# Patient Record
Sex: Male | Born: 1985 | Race: White | Hispanic: No | Marital: Single | Smoking: Current every day smoker
Health system: Southern US, Community
[De-identification: ages and names within clinical notes are randomized; demographics above are authoritative.]

---
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
## 2008-01-08 ENCOUNTER — Emergency Department (HOSPITAL_COMMUNITY): Admission: EM | Admit: 2008-01-08 | Discharge: 2008-01-08 | Payer: Self-pay | Admitting: Emergency Medicine

## 2008-01-19 ENCOUNTER — Emergency Department (HOSPITAL_COMMUNITY): Admission: EM | Admit: 2008-01-19 | Discharge: 2008-01-19 | Payer: Self-pay | Admitting: Emergency Medicine

## 2008-06-17 ENCOUNTER — Emergency Department (HOSPITAL_COMMUNITY): Admission: EM | Admit: 2008-06-17 | Discharge: 2008-06-17 | Payer: Self-pay | Admitting: Emergency Medicine

## 2008-06-22 ENCOUNTER — Emergency Department (HOSPITAL_COMMUNITY): Admission: EM | Admit: 2008-06-22 | Discharge: 2008-06-22 | Payer: Self-pay | Admitting: Emergency Medicine

## 2008-09-13 ENCOUNTER — Emergency Department (HOSPITAL_COMMUNITY): Admission: EM | Admit: 2008-09-13 | Discharge: 2008-09-13 | Payer: Self-pay | Admitting: Emergency Medicine

## 2009-10-09 ENCOUNTER — Emergency Department (HOSPITAL_COMMUNITY): Admission: EM | Admit: 2009-10-09 | Discharge: 2009-10-09 | Payer: Self-pay | Admitting: Emergency Medicine

## 2011-03-11 ENCOUNTER — Inpatient Hospital Stay (INDEPENDENT_AMBULATORY_CARE_PROVIDER_SITE_OTHER)
Admission: RE | Admit: 2011-03-11 | Discharge: 2011-03-11 | Disposition: A | Discharge status: Home / Self Care | Payer: Self-pay | Source: Ambulatory Visit | Attending: Family Medicine | Admitting: Family Medicine

## 2011-03-11 DIAGNOSIS — K047 Periapical abscess without sinus: Secondary | ICD-10-CM

## 2016-12-10 ENCOUNTER — Encounter: Payer: Self-pay | Admitting: Licensed Clinical Social Worker

## 2016-12-10 ENCOUNTER — Ambulatory Visit: Payer: Self-pay | Attending: Family Medicine | Admitting: Family Medicine

## 2016-12-10 VITALS — BP 129/88 | HR 73 | Temp 98.3°F | Resp 18 | Ht 71.0 in | Wt 192.4 lb

## 2016-12-10 DIAGNOSIS — K5903 Drug induced constipation: Secondary | ICD-10-CM

## 2016-12-10 DIAGNOSIS — F329 Major depressive disorder, single episode, unspecified: Secondary | ICD-10-CM | POA: Insufficient documentation

## 2016-12-10 DIAGNOSIS — R140 Abdominal distension (gaseous): Secondary | ICD-10-CM | POA: Insufficient documentation

## 2016-12-10 DIAGNOSIS — G4700 Insomnia, unspecified: Secondary | ICD-10-CM

## 2016-12-10 DIAGNOSIS — F32A Depression, unspecified: Secondary | ICD-10-CM

## 2016-12-10 DIAGNOSIS — Z79891 Long term (current) use of opiate analgesic: Secondary | ICD-10-CM | POA: Insufficient documentation

## 2016-12-10 DIAGNOSIS — K5909 Other constipation: Secondary | ICD-10-CM | POA: Insufficient documentation

## 2016-12-10 DIAGNOSIS — F1410 Cocaine abuse, uncomplicated: Secondary | ICD-10-CM | POA: Insufficient documentation

## 2016-12-10 MED ORDER — FLEET ENEMA 7-19 GM/118ML RE ENEM: 1.0000 | ENEMA | Freq: Once | RECTAL | 0 refills | Status: AC

## 2016-12-10 MED ORDER — SENNOSIDES-DOCUSATE SODIUM 8.6-50 MG PO TABS: 2.0000 | ORAL_TABLET | Freq: Every day | ORAL | 3 refills | Status: DC

## 2016-12-10 MED ORDER — BUPROPION HCL ER (SR) 150 MG PO TB12: 150.0000 mg | ORAL_TABLET | Freq: Every day | ORAL | 1 refills | Status: DC

## 2016-12-10 MED ORDER — SENNOSIDES-DOCUSATE SODIUM 8.6-50 MG PO TABS: 2.0000 | ORAL_TABLET | Freq: Every day | ORAL | Status: DC

## 2016-12-10 MED ORDER — FLEET ENEMA 7-19 GM/118ML RE ENEM: 1.0000 | ENEMA | Freq: Once | RECTAL | 0 refills | Status: DC

## 2016-12-10 MED FILL — ?BUPROPION HCL SR 150 MG TA: 150 | 30 days supply | Qty: 60 | Fill #0

## 2016-12-10 NOTE — Progress Notes (Signed)
Patient is here for establish care  Patient complains about no appetite he said that he eats till 5 pm  Patient stated that he has a bowel movement every 2/3 weeks   Patient requested to get his liver check  Patient declined the flu shot today

## 2016-12-10 NOTE — Progress Notes (Signed)
Subjective:  Patient ID: Stephen Garrison, male    DOB: 1986/09/26  Age: 31 y.o. MRN: [id number]  CC: Establish Care   HPI Zackarie Chason presents forA 2 week history of constipation. He reports history of chronic constipation which worsened one year ago.He reports taking Colace every day or usually every other day. He reports taking methadone 80 mg daily. He reports history of substance abuse which include cocaine and OxyContin in the past. Reports receiving ongoing treatment at [facility]. He also reports history of depression and difficulty sleeping. He denies any SI/HI.      No outpatient prescriptions prior to visit.   No facility-administered medications prior to visit.     ROS Review of Systems  Constitutional: Negative.   Respiratory: Negative.   Cardiovascular: Negative.   Gastrointestinal: Positive for abdominal distention and constipation.  Psychiatric/Behavioral: Positive for dysphoric mood and sleep disturbance. Negative for suicidal ideas. The patient is nervous/anxious.     Objective:  BP 129/88 (BP Location: Left Arm, Patient Position: Sitting, Cuff Size: Normal)   Pulse 73   Temp 98.3 F (36.8 C) (Oral)   Resp 18   Ht 5\' 11"  (1.803 m)   Wt 192 lb 6.4 oz (87.3 kg)   SpO2 96%   BMI 26.83 kg/m   BP/Weight 12/10/2016  Systolic BP 129  Diastolic BP 88  Wt. (Lbs) 192.4  BMI 26.83     Physical Exam  HENT:  Mouth/Throat: Oropharynx is clear and moist.  Cardiovascular: Normal rate, regular rhythm, normal heart sounds and intact distal pulses.   Pulmonary/Chest: Effort normal and breath sounds normal.  Abdominal: Soft. Bowel sounds are normal.  Psychiatric: His speech is delayed. He is slowed. He exhibits a depressed mood. He expresses no homicidal and no suicidal ideation. He expresses no suicidal plans and no homicidal plans.  Nursing note and vitals reviewed.    Assessment & Plan:   Problem List Items Addressed This Visit    -LCSW  provided patient with counseling resources.    Visit Diagnoses    Drug-induced constipation    -  Primary   -Recommended OTC fleet enema.   Relevant Medications   senna-docusate (SENNA S) 8.6-50 MG tablet   Depression, unspecified depression type       Relevant Medications   buPROPion (WELLBUTRIN SR) 150 MG 12 hr tablet   Insomnia, unspecified type       Relevant Medications   buPROPion (WELLBUTRIN SR) 150 MG 12 hr tablet      Meds ordered this encounter  Medications  . DISCONTD: senna-docusate (SENNA S) 8.6-50 MG tablet    Sig: Take 2 tablets by mouth at bedtime.    Order Specific Question:   Supervising Provider    Answer:   Quentin Angst [id number]  . DISCONTD: buPROPion (WELLBUTRIN SR) 150 MG 12 hr tablet    Sig: Take 1 tablet (150 mg total) by mouth daily. After 3 days, may increase to 1 tablet (150 mg each) twice a day.    Dispense:  60 tablet    Refill:  1    Order Specific Question:   Supervising Provider    Answer:   Quentin Angst [id number]  . DISCONTD: sodium phosphate (FLEET) 7-19 GM/118ML ENEM    Sig: Place 133 mLs (1 enema total) rectally once.    Refill:  0    Order Specific Question:   Supervising Provider    Answer:   Quentin Angst [id number]  . DISCONTD: senna-docusate (SENNA  S) 8.6-50 MG tablet    Sig: Take 2 tablets by mouth at bedtime.    Dispense:  60 tablet    Refill:  3    Order Specific Question:   Supervising Provider    Answer:   Quentin AngstJEGEDE, OLUGBEMIGA E [id number]]  . senna-docusate (SENNA S) 8.6-50 MG tablet    Sig: Take 2 tablets by mouth at bedtime.    Dispense:  60 tablet    Refill:  3    Order Specific Question:   Supervising Provider    Answer:   Quentin AngstJEGEDE, OLUGBEMIGA E [id number]]  . sodium phosphate (FLEET) 7-19 GM/118ML ENEM    Sig: Place 133 mLs (1 enema total) rectally once.    Refill:  0    Order Specific Question:   Supervising Provider    Answer:   Quentin AngstJEGEDE, OLUGBEMIGA E [id number]]  . buPROPion (WELLBUTRIN SR) 150 MG 12  hr tablet    Sig: Take 1 tablet (150 mg total) by mouth daily. After 3 days, may increase to 1 tablet (150 mg each) twice a day.    Dispense:  60 tablet    Refill:  1    Order Specific Question:   Supervising Provider    Answer:   Quentin AngstJEGEDE, OLUGBEMIGA E [id number]]    Follow-up: Return if symptoms worsen or fail to improve. Schedule Physical in about 2 weeks (around 12/24/2016).   Lizbeth BarkMandesia R Hairston FNP

## 2016-12-10 NOTE — Patient Instructions (Addendum)
Follow up if symptoms do not improve. Schedule appointment for physical in 2 weeks.    Constipation, Adult Constipation is when a person:  Poops (has a bowel movement) fewer times in a week than normal.  Has a hard time pooping.  Has poop that is dry, hard, or bigger than normal. Follow these instructions at home: Eating and drinking  Eat foods that have a lot of fiber, such as:  Fresh fruits and vegetables.  Whole grains.  Beans.  Eat less of foods that are high in fat, low in fiber, or overly processed, such as:  [place] fries.  Hamburgers.  Cookies.  Candy.  Soda.  Drink enough fluid to keep your pee (urine) clear or pale yellow. General instructions  Exercise regularly or as told by your doctor.  Go to the restroom when you feel like you need to poop. Do not hold it in.  Take over-the-counter and prescription medicines only as told by your doctor. These include any fiber supplements.  Do pelvic floor retraining exercises, such as:  Doing deep breathing while relaxing your lower belly (abdomen).  Relaxing your pelvic floor while pooping.  Watch your condition for any changes.  Keep all follow-up visits as told by your doctor. This is important. Contact a doctor if:  You have pain that gets worse.  You have a fever.  You have not pooped for 4 days.  You throw up (vomit).  You are not hungry.  You lose weight.  You are bleeding from the anus.  You have thin, pencil-like poop (stool). Get help right away if:  You have a fever, and your symptoms suddenly get worse.  You leak poop or have blood in your poop.  Your belly feels hard or bigger than normal (is bloated).  You have very bad belly pain.  You feel dizzy or you faint. This information is not intended to replace advice given to you by your health care provider. Make sure you discuss any questions you have with your health care provider. Document Released: 04/01/2008 Document  Revised: 05/03/2016 Document Reviewed: 04/03/2016 Elsevier Interactive Patient Education  2017 [employer]

## 2016-12-12 NOTE — BH Specialist Note (Signed)
Session Start time: 10:45 AM   End Time: 11:15 AM Total Time:  30 minutes Type of Service: Behavioral Health - Individual/Family Interpreter: No.   Interpreter Name & Language: N/A # [facility]-June [facility]   SUBJECTIVE: Sheran LawlessCharles Hutt is a 31 y.o. male  Pt. was referred by FNP Jenelle MagesHairston for:  anxiety, depression and community resources (substance use treatment). Pt. reports the following symptoms/concerns: decreased appetite, difficulty staying asleep, racing thoughts, and irritability Duration of problem:  Ongoing Pt reports that Bipolar, Anxiety, and Depression is common with maternal family Severity: moderate Previous treatment: [facility] two [facility], stating "it didn't help much"   OBJECTIVE: Mood: Pleasant & Affect: Appropriate Risk of harm to self or others: Pt reports hx of SI/HI. Pt denies current plan or intent to self-harm and/or harm others Assessments administered: PHQ-9; GAD-7  LIFE CONTEXT:  Family & Social: Pt resides with his mother and young adult brother. He has a 42seven year old daughter who he hasn't seen in months due to ongoing conflict with daughter's step-father School/ Work: Pt is unemployed. He is receiving food stamps ($189) Pt was denied Medicaid and is uninsured Self-Care: Pt has hx of using cocaine, opioids, and OxyContin. He has [facility] for five years and is currently on methadone Life changes: Pt is grieving the loss of his father (10/19/16) and has initiated court involvement to assist in obtaining visitation rights with daughter What is important to pt/family (values): Family, Sobriety, Independence   GOALS ADDRESSED:  Decrease symptoms of depression Increase adequate support system for patient  INTERVENTIONS: Solution Focused, Strength-based and Supportive   ASSESSMENT:  Pt currently experiencing depression triggered by financial strain  and ongoing conflict regarding visitation rights to see daughter. Pt reports decreased appetite, difficulty staying asleep, racing thoughts, irritability, and suicidal/homocidal ideations. He is currently participating in substance use treatment through [facility]. Pt may benefit from psychotherapy and medication management. LCSWA provided education on how psychosocial stressors can negatively impact one's mental and physical health. LCSWA discussed benefits of applying healthy coping skills to decrease symptoms. LCSWA inquired about protective factors and assisted pt in creating a safety plan in crisis. Pt successfully identified strategies to utilize on a daily basis and when attempting to avoid conflict with daughter's step-father. LCSWA strongly encouraged pt to schedule appointment with Financial Counselor to assist with financial strain and provided pt with employment resources for job readiness.       PLAN: 1. F/U with behavioral health clinician: Pt was encouraged to contact LCSWA if symptoms worsen or fail to improve to [facility]. 2. Behavioral Health meds: Wellbutrin 3. Behavioral recommendations: LCSWA recommends that pt apply healthy coping skills discussed, continue to participate in substance use treatment, and utilize resources. Pt is encouraged to schedule follow up appointment with LCSWA 4. Referral: Brief Counseling/Psychotherapy, [facility], Problem-solving teaching/coping strategies, Psychoeducation and Supportive Counseling 5. From scale of 1-10, how likely are you to follow plan: 8/10   Bridgett LarssonJasmine D Lewis, MSW, [facility] Clinical Social Worker 12/12/16 10:07 AM  Warmhandoff:   Warm Hand Off Completed.

## 2016-12-24 ENCOUNTER — Ambulatory Visit: Payer: Self-pay | Attending: Family Medicine

## 2016-12-24 ENCOUNTER — Ambulatory Visit (HOSPITAL_BASED_OUTPATIENT_CLINIC_OR_DEPARTMENT_OTHER): Payer: Self-pay | Admitting: Family Medicine

## 2016-12-24 ENCOUNTER — Encounter: Payer: Self-pay | Admitting: Licensed Clinical Social Worker

## 2016-12-24 VITALS — BP 131/87 | HR 68 | Temp 98.4°F | Resp 18 | Ht 72.0 in | Wt 190.0 lb

## 2016-12-24 DIAGNOSIS — F1911 Other psychoactive substance abuse, in remission: Secondary | ICD-10-CM | POA: Insufficient documentation

## 2016-12-24 DIAGNOSIS — F329 Major depressive disorder, single episode, unspecified: Secondary | ICD-10-CM

## 2016-12-24 DIAGNOSIS — Z0000 Encounter for general adult medical examination without abnormal findings: Secondary | ICD-10-CM | POA: Insufficient documentation

## 2016-12-24 DIAGNOSIS — Z87898 Personal history of other specified conditions: Secondary | ICD-10-CM

## 2016-12-24 DIAGNOSIS — K5909 Other constipation: Secondary | ICD-10-CM | POA: Insufficient documentation

## 2016-12-24 DIAGNOSIS — F32A Depression, unspecified: Secondary | ICD-10-CM | POA: Insufficient documentation

## 2016-12-24 DIAGNOSIS — F1910 Other psychoactive substance abuse, uncomplicated: Secondary | ICD-10-CM | POA: Insufficient documentation

## 2016-12-24 LAB — CBC WITH DIFFERENTIAL/PLATELET
BASOS PCT: 0 %
Basophils Absolute: 0 cells/uL (ref 0–200)
EOS ABS: 92 {cells}/uL (ref 15–500)
EOS PCT: 1 %
HCT: 44.6 % (ref 38.5–50.0)
Hemoglobin: 15.2 g/dL (ref 13.2–17.1)
Lymphocytes Relative: 28 %
Lymphs Abs: 2576 cells/uL (ref 850–3900)
MCH: 29.7 pg (ref 27.0–33.0)
MCHC: 34.1 g/dL (ref 32.0–36.0)
MCV: 87.3 fL (ref 80.0–100.0)
MONOS PCT: 6 %
MPV: 11.4 fL (ref 7.5–12.5)
Monocytes Absolute: 552 cells/uL (ref 200–950)
NEUTROS ABS: 5980 {cells}/uL (ref 1500–7800)
Neutrophils Relative %: 65 %
PLATELETS: 204 10*3/uL (ref 140–400)
RBC: 5.11 MIL/uL (ref 4.20–5.80)
RDW: 12.9 % (ref 11.0–15.0)
WBC: 9.2 10*3/uL (ref 3.8–10.8)

## 2016-12-24 LAB — BASIC METABOLIC PANEL WITH GFR
BUN: 9 mg/dL (ref 7–25)
CALCIUM: 9.7 mg/dL (ref 8.6–10.3)
CO2: 27 mmol/L (ref 20–31)
Chloride: 101 mmol/L (ref 98–110)
Creat: 1 mg/dL (ref 0.60–1.35)
GFR, Est African American: >89 mL/min (ref >=60)
Glucose, Bld: 81 mg/dL (ref 65–99)
POTASSIUM: 4.5 mmol/L (ref 3.5–5.3)
SODIUM: 135 mmol/L (ref 135–146)

## 2016-12-24 LAB — HEPATIC FUNCTION PANEL
ALK PHOS: 65 U/L (ref 40–115)
ALT: 38 U/L (ref 9–46)
AST: 27 U/L (ref 10–40)
Albumin: 4.5 g/dL (ref 3.6–5.1)
BILIRUBIN DIRECT: 0.1 mg/dL (ref <=0.2)
BILIRUBIN TOTAL: 0.3 mg/dL (ref 0.2–1.2)
Indirect Bilirubin: 0.2 mg/dL (ref 0.2–1.2)
Total Protein: 7.4 g/dL (ref 6.1–8.1)

## 2016-12-24 LAB — LIPID PANEL
Cholesterol: 185 mg/dL (ref <200)
HDL: 40 mg/dL — ABNORMAL LOW (ref >40)
LDL Cholesterol: 119 mg/dL — ABNORMAL HIGH (ref <100)
Total CHOL/HDL Ratio: 4.6 Ratio (ref <5.0)
Triglycerides: 128 mg/dL (ref <150)
VLDL: 26 mg/dL (ref <30)

## 2016-12-24 NOTE — BH Specialist Note (Signed)
LCSWA visited with pt to follow up on behavioral health.  Pt reported that he is complying with medication management (wellbutrin) He stated that he has not yet observed a difference in symptoms; however, understands that it can take 4-6 weeks to see results.   Pt completed appointment with Financial Counseling and has applied for [employer]. No additional concerns noted. Pt was encouraged tocontact LCSWA if symptoms worsen or fail to improveto schedule behavioral appointments at [facility].

## 2016-12-24 NOTE — Progress Notes (Signed)
Subjective:   Patient ID: Stephen Lawlessharles Garrison, male    DOB: 07/26/1986, 31 y.o.   MRN: [id number]  Chief Complaint  Patient presents with  . Annual Exam   HPI Stephen LawlessCharles Horsman 31 y.o. male presents    Annual physical exam: History of chronic constipation and methadone use. Denies symptoms of all other pertinent systems.  History of Depression: PHQ9 score elevated this visit. He denies any SI/HI. He reports visitation issues and recent history of father's death as a stressor. Reports support system includes his mother. He is adherent with antidepressant medication.                                                                       No past medical history on file.  No past surgical history on file.  Family History  Problem Relation Age of Onset  . Hypertension Mother   . Panic disorder Mother   . Anxiety disorder Mother   . Diabetes Maternal Grandmother     Social History   Social History  . Marital status: Single    Spouse name: N/A  . Number of children: N/A  . Years of education: N/A   Occupational History  . Not on file.   Social History Main Topics  . Smoking status: Not on file  . Smokeless tobacco: Not on file  . Alcohol use Not on file  . Drug use: Unknown  . Sexual activity: Not on file   Other Topics Concern  . Not on file   Social History Narrative  . No narrative on file    Outpatient Medications Prior to Visit  Medication Sig Dispense Refill  . buPROPion (WELLBUTRIN SR) 150 MG 12 hr tablet Take 1 tablet (150 mg total) by mouth daily. After 3 days, may increase to 1 tablet (150 mg each) twice a day. 60 tablet 1  . methadone (DOLOPHINE) 10 MG/5ML solution Take 80 mg by mouth every morning.    . senna-docusate (SENNA S) 8.6-50 MG tablet Take 2 tablets by mouth at bedtime. (Patient not taking: Reported on 12/24/2016) 60 tablet 3   No facility-administered medications prior to visit.     No Known Allergies  Review of Systems  Constitutional: Negative.     HENT: Negative.   Eyes: Negative.   Respiratory: Negative.   Cardiovascular: Negative.   Gastrointestinal: Positive for abdominal pain and nausea.  Genitourinary: Negative.   Musculoskeletal: Negative.   Skin: Negative.   Neurological: Negative.   Endo/Heme/Allergies: Negative.   Psychiatric/Behavioral: Positive for depression (history of depression) and substance abuse (past history of substance abuse.).       Objective:    Physical Exam  Constitutional: He is oriented to person, place, and time. He appears well-developed and well-nourished.  HENT:  Head: Normocephalic and atraumatic.  Right Ear: External ear normal.  Left Ear: External ear normal.  Nose: Nose normal.  Mouth/Throat: Oropharynx is clear and moist.  Eyes: Conjunctivae and EOM are normal. Pupils are equal, round, and reactive to light.  Neck: Normal range of motion. Neck supple.  Cardiovascular: Normal rate, regular rhythm, normal heart sounds and intact distal pulses.   Pulmonary/Chest: Effort normal and breath sounds normal.  Abdominal: Soft. Bowel sounds are normal.  Musculoskeletal: Normal range  of motion.  Neurological: He is alert and oriented to person, place, and time. He has normal reflexes.  Skin: Skin is warm and dry.  Psychiatric: He expresses no homicidal and no suicidal ideation. He expresses no suicidal plans and no homicidal plans.  Nursing note and vitals reviewed.   BP 131/87 (BP Location: Left Arm, Patient Position: Sitting, Cuff Size: Normal)   Pulse 68   Temp 98.4 F (36.9 C) (Oral)   Resp 18   Ht 6' (1.829 m)   Wt 190 lb (86.2 kg)   SpO2 97%   BMI 25.77 kg/m  Wt Readings from Last 3 Encounters:  12/24/16 190 lb (86.2 kg)  12/10/16 192 lb 6.4 oz (87.3 kg)     There is no immunization history on file for this patient.  Lab Results  Component Value Date   TSH 4.20 12/24/2016   Lab Results  Component Value Date   WBC 9.2 12/24/2016   HGB 15.2 12/24/2016   HCT 44.6  12/24/2016   MCV 87.3 12/24/2016   PLT 204 12/24/2016   Lab Results  Component Value Date   NA 135 12/24/2016   K 4.5 12/24/2016   CO2 27 12/24/2016   GLUCOSE 81 12/24/2016   BUN 9 12/24/2016   CREATININE 1.00 12/24/2016   BILITOT 0.3 12/24/2016   ALKPHOS 65 12/24/2016   AST 27 12/24/2016   ALT 38 12/24/2016   PROT 7.4 12/24/2016   ALBUMIN 4.5 12/24/2016   CALCIUM 9.7 12/24/2016   Lab Results  Component Value Date   CHOL 185 12/24/2016   Lab Results  Component Value Date   HDL 40 (L) 12/24/2016   Lab Results  Component Value Date   LDLCALC 119 (H) 12/24/2016   Lab Results  Component Value Date   TRIG 128 12/24/2016   Lab Results  Component Value Date   CHOLHDL 4.6 12/24/2016   Lab Results  Component Value Date   HGBA1C 5.3 12/24/2016       Assessment & Plan:   Problem List Items Addressed This Visit      Other   Depression   -LCSW spoke with patient and provided resources.   History of substance abuse   -Pt.reports receiving drug counseling.    Other Visit Diagnoses    Annual physical exam    -  Primary   Relevant Orders   BASIC METABOLIC PANEL WITH GFR (Completed)   CBC with Differential (Completed)   TSH (Completed)   Hemoglobin A1c (Completed)   Vitamin D, 25-hydroxy (Completed)   Lipid Panel (Completed)   Hepatic Function Panel (Completed)       Return in about 4 weeks (around 01/21/2017) for Depression Follow Up .   Arrie Senate, FNP

## 2016-12-24 NOTE — Progress Notes (Signed)
Patient is here for physical  Patient denies pain today  Patient has not eaten today  Patient ha staking his current meds today  Patient declined the flu shot today

## 2016-12-24 NOTE — Patient Instructions (Signed)
Living With Depression Everyone experiences occasional disappointment, sadness, and loss in their lives. When you are feeling down, blue, or sad for at least 2 weeks in a row, it may mean that you have depression. Depression can affect your thoughts and feelings, relationships, daily activities, and physical health. It is caused by changes in the way your brain functions. If you receive a diagnosis of depression, your health care provider will tell you which type of depression you have and what treatment options are available to you. If you are living with depression, there are ways to help you recover from it and also ways to prevent it from coming back. How to cope with lifestyle changes Coping with stress  Stress is your body's reaction to life changes and events, both good and bad. Stressful situations may include:  Getting married.  The death of a spouse.  Losing a job.  Retiring.  Having a baby. Stress can last just a few hours or it can be ongoing. Stress can play a major role in depression, so it is important to learn both how to cope with stress and how to think about it differently. Talk with your health care provider or a counselor if you would like to learn more about stress reduction. He or she may suggest some stress reduction techniques, such as:  Music therapy. This can include creating music or listening to music. Choose music that you enjoy and that inspires you.  Mindfulness-based meditation. This kind of meditation can be done while sitting or walking. It involves being aware of your normal breaths, rather than trying to control your breathing.  Centering prayer. This is a kind of meditation that involves focusing on a spiritual word or phrase. Choose a word, phrase, or sacred image that is meaningful to you and that brings you peace.  Deep breathing. To do this, expand your stomach and inhale slowly through your nose. Hold your breath for 3-5 seconds, then exhale slowly,  allowing your stomach muscles to relax.  Muscle relaxation. This involves intentionally tensing muscles then relaxing them. Choose a stress reduction technique that fits your lifestyle and personality. Stress reduction techniques take time and practice to develop. Set aside 5-15 minutes a day to do them. Therapists can offer training in these techniques. The training may be covered by some insurance plans. Other things you can do to manage stress include:  Keeping a stress diary. This can help you learn what triggers your stress and ways to control your response.  Understanding what your limits are and saying no to requests or events that lead to a schedule that is too full.  Thinking about how you respond to certain situations. You may not be able to control everything, but you can control how you react.  Adding humor to your life by watching funny films or TV shows.  Making time for activities that help you relax and not feeling guilty about spending your time this way. Medicines  Your health care provider may suggest certain medicines if he or she feels that they will help improve your condition. Avoid using alcohol and other substances that may prevent your medicines from working properly (may interact). It is also important to:  Talk with your pharmacist or health care provider about all the medicines that you take, their possible side effects, and what medicines are safe to take together.  Make it your goal to take part in all treatment decisions (shared decision-making). This includes giving input on the side effects   of medicines. It is best if shared decision-making with your health care provider is part of your total treatment plan. If your health care provider prescribes a medicine, you may not notice the full benefits of it for 4-8 weeks. Most people who are treated for depression need to be on medicine for at least 6-12 months after they feel better. If you are taking medicines as  part of your treatment, do not stop taking medicines without first talking to your health care provider. You may need to have the medicine slowly decreased (tapered) over time to decrease the risk of harmful side effects. Relationships  Your health care provider may suggest family therapy along with individual therapy and drug therapy. While there may not be family problems that are causing you to feel depressed, it is still important to make sure your family learns as much as they can about your mental health. Having your family's support can help make your treatment successful. How to recognize changes in your condition Everyone has a different response to treatment for depression. Recovery from major depression happens when you have not had signs of major depression for two months. This may mean that you will start to:  Have more interest in doing activities.  Feel less hopeless than you did 2 months ago.  Have more energy.  Overeat less often, or have better or improving appetite.  Have better concentration. Your health care provider will work with you to decide the next steps in your recovery. It is also important to recognize when your condition is getting worse. Watch for these signs:  Having fatigue or low energy.  Eating too much or too little.  Sleeping too much or too little.  Feeling restless, agitated, or hopeless.  Having trouble concentrating or making decisions.  Having unexplained physical complaints.  Feeling irritable, angry, or aggressive. Get help as soon as you or your family members notice these symptoms coming back. How to get support and help from others How to talk with friends and family members about your condition  Talking to friends and family members about your condition can provide you with one way to get support and guidance. Reach out to trusted friends or family members, explain your symptoms to them, and let them know that you are working with a  health care provider to treat your depression. Financial resources  Not all insurance plans cover mental health care, so it is important to check with your insurance carrier. If paying for co-pays or counseling services is a problem, search for a local or county mental health care center. They may be able to offer public mental health care services at low or no cost when you are not able to see a private health care provider. If you are taking medicine for depression, you may be able to get the generic form, which may be less expensive. Some makers of prescription medicines also offer help to patients who cannot afford the medicines they need. Follow these instructions at home:  Get the right amount and quality of sleep.  Cut down on using caffeine, tobacco, alcohol, and other potentially harmful substances.  Try to exercise, such as walking or lifting small weights.  Take over-the-counter and prescription medicines only as told by your health care provider.  Eat a healthy diet that includes plenty of vegetables, fruits, whole grains, low-fat dairy products, and lean protein. Do not eat a lot of foods that are high in solid fats, added sugars, or salt.  Keep   all follow-up visits as told by your health care provider. This is important. Contact a health care provider if:  You stop taking your antidepressant medicines, and you have any of these symptoms:  Nausea.  Headache.  Feeling lightheaded.  Chills and body aches.  Not being able to sleep (insomnia).  You or your friends and family think your depression is getting worse. Get help right away if:  You have thoughts of hurting yourself or others. If you ever feel like you may hurt yourself or others, or have thoughts about taking your own life, get help right away. You can go to your nearest emergency department or call:  Your local emergency services (911 in the [place]  A suicide crisis helpline, such as the National Suicide  Prevention Lifeline at (334)217-07191-813-589-2861. This is open 24-hours a day. Summary  If you are living with depression, there are ways to help you recover from it and also ways to prevent it from coming back.  Work with your health care team to create a management plan that includes counseling, stress management techniques, and healthy lifestyle habits. This information is not intended to replace advice given to you by your health care provider. Make sure you discuss any questions you have with your health care provider. Document Released: 09/16/2016 Document Revised: 09/16/2016 Document Reviewed: 09/16/2016 Elsevier Interactive Patient Education  2017 [employer]   Healthy Eating to Prevent Digestive Disorders The digestive system starts at the mouth and goes all the way down to the rectum. Along the way, your digestive system breaks down the food you eat so you can absorb its nutrients and use them for energy. Digestive disorders can cause gas, bloating, pain, heartburn, and other symptoms. They can prevent your digestive system from doing its job. Healthy eating and a healthy lifestyle can help you avoid many common digestive disorders. What nutrition changes can be made? Start by eating a balanced diet. Eat healthy foods from all the major food groups. These include carbohydrates, fats, and proteins. Other changes you can make include to:  Eat enough fiber. Fiber is a healthy carbohydrate that cleans out your digestive system. Fiber absorbs water and helps you have regular bowel movements. Fiber comes from plants. To get enough fiber in your diet, eat 4-5 servings of fruits, vegetables, and legumes every day. Include beans and whole grains. Most people should get 20?35 grams of fiber each day.  Drink enough water to keep your urine clear or pale yellow. Water helps your body digest food. It can also help prevent constipation.  Avoid fatty proteins. Full-fat dairy products and fatty meats are hard  to digest. Fats you want to avoid are those that get solid at room temperature (saturated fats). Instead of eating these kinds of fats, eat plant-based unsaturated fats found in olives, canola, corn, avocado, and nuts.  If you have trouble with gas, belching, or flatulence, avoid gas-producing foods. These include beans, carbonated beverages, cabbage, cauliflower, and broccoli. If you are lactose intolerant, avoid dairy products or choose lactose-free dairy products.  If you have frequent heartburn, stay away from alcohol, caffeine, fatty foods, chocolate, and peppermint. Avoid lying down within two hours of eating a full meal. Overeating and lying down too soon after a meal can cause heartburn.  Add probiotics to your diet. Healthy digestion depends on having the right balance of good bacteria in your colon. Probiotics can help restore the balance of good bacteria in your digestive system. Probiotics are live active cultures that are  found in yogurt, kefir, and cultured foods like sauerkraut and miso. You can also add good bacteria with probiotic supplements.  Make sure to chew your food slowly and completely.  Instead of eating three large meals each day, eat three small meals with three small snacks. What other changes can I make? You can help your digestive system stay healthy by making these lifestyle changes:  Stay active and exercise every day.  Maintain a healthy weight.  Eat on a regular schedule.  Avoid tight-fitting clothes. They can restrict digestion.  If you have frequent heartburn, raise the head of your bed 2-3 inches (5-7.5 cm).  Do not use any tobacco products, such as cigarettes, chewing tobacco, and e-cigarettes. If you need help quitting, ask your health care provider.  Limit alcohol intake to no more than 1 drink a day for nonpregnant women and 2 drinks a day for men. One drink equals 12 oz of beer, 5 oz of wine, or 1 oz of hard liquor.  Avoid stress. Find ways to  reduce stress, such as meditation, exercise, or taking time for activities that relax you. Why should I make these changes? Making these changes will help your digestive system function at its best. A healthy digestive system can help you avoid or improve your management of digestive disorders such as:  Bloating, gas, and flatulence.  Heartburn.  Gastroesophageal reflux disease (GERD).  Peptic ulcer disease.  Hemorrhoids.  Diverticulitis.  Constipation.  Diarrhea.  Gall stones  Irritable bowel syndrome.  Malnutrition.  Fatty liver disease. What can happen if changes are not made? Not making these changes could put you at risk for many conditions caused by a poor diet or an unhealthy weight, such as heart disease, stroke and diabetes. Where can I get more information? Learn more about healthy eating and digestive disorders by visiting these websites:  Academy of Nutrition and Dietetics: SplashPops.ca  Centers for Disease Control and Prevention: TanClothes.com.cy  [place] Department of Health and Human Services: CosmeticsCritic.si.pdf Summary  A heathy diet can help prevent many digestive disorders.  Eat a balanced diet consisting of fiber, unsaturated fats, lean protein, fruits, and vegetables.  Eat three small meals with three small snacks per day.  Drink plenty of water every day.  Get plenty of exercise and maintain a healthy weight. This information is not intended to replace advice given to you by your health care provider. Make sure you discuss any questions you have with your health care provider. Document Released: 11/10/2015 Document Revised: 03/21/2016 Document Reviewed: 06/26/2016 Elsevier Interactive Patient Education  2017 [employer].

## 2016-12-25 LAB — VITAMIN D 25 HYDROXY (VIT D DEFICIENCY, FRACTURES): VIT D 25 HYDROXY: 19 ng/mL — AB (ref 30–100)

## 2016-12-25 LAB — HEMOGLOBIN A1C
Hgb A1c MFr Bld: 5.3 % (ref <5.7)
MEAN PLASMA GLUCOSE: 105 mg/dL

## 2016-12-25 LAB — TSH: TSH: 4.2 m[IU]/L (ref 0.40–4.50)

## 2016-12-28 ENCOUNTER — Encounter: Payer: Self-pay | Admitting: Family Medicine

## 2016-12-30 ENCOUNTER — Other Ambulatory Visit: Payer: Self-pay | Admitting: Family Medicine

## 2016-12-30 ENCOUNTER — Telehealth: Payer: Self-pay

## 2016-12-30 DIAGNOSIS — E559 Vitamin D deficiency, unspecified: Secondary | ICD-10-CM

## 2016-12-30 MED ORDER — VITAMIN D (ERGOCALCIFEROL) 1.25 MG (50000 UNIT) PO CAPS: 50000.0000 [IU] | ORAL_CAPSULE | ORAL | 0 refills | Status: AC

## 2016-12-30 MED FILL — VIT D2 1.25 MG (50,000 UNIT: 1.25 MG | 60 days supply | Qty: 8 | Fill #0

## 2016-12-30 NOTE — Telephone Encounter (Signed)
-----   Message from Lizbeth BarkMandesia R Hairston, FNP sent at 12/30/2016  1:03 PM EST ----- Vitamin D level was low. Vitamin D helps to keep bones strong. You were prescribed ergocalciferol (capsules) to increase your vitamin-d level. Once finished start taking OTC vitamin d supplement with 800 international units (IU) of vitamin-d per day.  Recommend scheduling recheck in 3 months. Kidney function normal Liver function normal Thyroid function normal Labs normal. HgbA1c is normal which indicates you do not have diabetes.  -Lipid levels were elevated. This can increase your risk of heart disease overtime.  -Start eating a diet low in saturated fat. Limit your intake of fried foods, red meats, and whole milk. Increase activity.

## 2016-12-30 NOTE — Telephone Encounter (Signed)
CMA call to inform lab results  Patient did not answer but CMA left a VM stating the results and if have any questions just to call me back

## 2017-01-10 MED FILL — ?BUPROPION HCL SR 150 MG TA: 150 | 30 days supply | Qty: 60 | Fill #1

## 2017-01-21 ENCOUNTER — Ambulatory Visit: Payer: Self-pay | Attending: Family Medicine | Admitting: Family Medicine

## 2017-01-21 ENCOUNTER — Encounter: Payer: Self-pay | Admitting: Family Medicine

## 2017-01-21 VITALS — BP 134/83 | HR 75 | Temp 97.8°F | Resp 18 | Ht 71.0 in | Wt 191.4 lb

## 2017-01-21 DIAGNOSIS — F32A Depression, unspecified: Secondary | ICD-10-CM

## 2017-01-21 DIAGNOSIS — F329 Major depressive disorder, single episode, unspecified: Secondary | ICD-10-CM | POA: Insufficient documentation

## 2017-01-21 DIAGNOSIS — M940 Chondrocostal junction syndrome [Tietze]: Secondary | ICD-10-CM

## 2017-01-21 MED ORDER — ARIPIPRAZOLE 5 MG PO TABS: 5.0000 mg | ORAL_TABLET | Freq: Every day | ORAL | 1 refills | Status: DC

## 2017-01-21 MED ORDER — IBUPROFEN 600 MG PO TABS: 600.0000 mg | ORAL_TABLET | Freq: Three times a day (TID) | ORAL | 0 refills | Status: DC | PRN

## 2017-01-21 MED ORDER — CYCLOBENZAPRINE HCL 5 MG PO TABS: 5.0000 mg | ORAL_TABLET | Freq: Two times a day (BID) | ORAL | 0 refills | Status: DC | PRN

## 2017-01-21 MED FILL — ARIPiprazole 5 MG TABS: 5 | 15 days supply | Qty: 30 | Fill #0

## 2017-01-21 MED FILL — CYCLOBENZAPRINE 5 MG TABLET: 5 | 7 days supply | Qty: 14 | Fill #0

## 2017-01-21 NOTE — Progress Notes (Signed)
Patient is here for FU Depression  Patient complains of right side chest tenderness being present intermittently over the past three weeks.  Patient has taken medication today. Patient has not eaten today.  Patient declined the flu vaccine today.

## 2017-01-21 NOTE — Progress Notes (Signed)
Subjective:  Patient ID: Stephen Garrison, male    DOB: 06/02/1986  Age: 31 y.o. MRN: [id number]  CC: Depression   HPI Stephen LawlessCharles Garrison presents for    History of Depression: PHQ9 score elevated this visit. He denies any SI/HI. Reports support system includes his mother. He is adherent with antidepressant medication. Denies any adverse medication side effects. Denies any worsening of depression symptoms. He reports receiving counseling services through his susbstance abuse program. He has also been given information for additional counseling resources in the past by LCSW in clinic. Reports minimal relief of symptoms. He is requesting to try another anti-depressant medication, Abilify.   Costochondritis: Right sided chest wall pain. Reports moving a 3 weeks ago. Reports lifting a couch at the time. Reports taking OTC ibuprofen for pain. Denies any SOB or radiating chest pain.     Outpatient Medications Prior to Visit  Medication Sig Dispense Refill  . methadone (DOLOPHINE) 10 MG/5ML solution Take 80 mg by mouth every morning.    . senna-docusate (SENNA S) 8.6-50 MG tablet Take 2 tablets by mouth at bedtime. 60 tablet 3  . Vitamin D, Ergocalciferol, (DRISDOL) 50000 units CAPS capsule Take 1 capsule (50,000 Units total) by mouth every 7 (seven) days. 8 capsule 0  . buPROPion (WELLBUTRIN SR) 150 MG 12 hr tablet Take 1 tablet (150 mg total) by mouth daily. After 3 days, may increase to 1 tablet (150 mg each) twice a day. 60 tablet 1   No facility-administered medications prior to visit.     ROS Review of Systems  Respiratory: Negative.   Cardiovascular: Positive for chest pain.  Gastrointestinal: Negative.   Skin: Negative.   Psychiatric/Behavioral: Positive for dysphoric mood.    Objective:  BP 134/83 (BP Location: Right Arm, Patient Position: Sitting, Cuff Size: Large)   Pulse 75   Temp 97.8 F (36.6 C) (Oral)   Resp 18   Ht 5\' 11"  (1.803 m)   Wt 191 lb 6.4 oz (86.8 kg)   SpO2 97%    BMI 26.69 kg/m   BP/Weight 01/21/2017 12/24/2016 12/10/2016  Systolic BP 134 131 129  Diastolic BP 83 87 88  Wt. (Lbs) 191.4 190 192.4  BMI 26.69 25.77 26.83    [facility] 01/21/2017 12/24/2016  Decreased Interest 3 3  Down, Depressed, Hopeless 3 3  PHQ - 2 Score 6 6  Altered sleeping 2 3  Tired, decreased energy 3 3  Change in appetite 2 3  Feeling bad or failure about yourself  3 3  Trouble concentrating 3 3  Moving slowly or fidgety/restless 0 0  Suicidal thoughts 1 2  PHQ-9 Score 20 23     Physical Exam  Cardiovascular: Normal rate, regular rhythm, normal heart sounds and intact distal pulses.   reproducible chest pain with palpation of chest wall.   Pulmonary/Chest: Effort normal and breath sounds normal.  Abdominal: Soft. Bowel sounds are normal.  Skin: Skin is warm and dry.  Psychiatric: His speech is delayed. He is slowed. He exhibits a depressed mood. He expresses no homicidal and no suicidal ideation. He expresses no suicidal plans and no homicidal plans.  Nursing note and vitals reviewed.    Assessment & Plan:   Problem List Items Addressed This Visit      Other   Depression - Primary   -Trial of Abilify for 6 weeks, follow up in office to assess symptom improvement.   Relevant Medications   ARIPiprazole (ABILIFY) 5 MG tablet  Other Visit Diagnoses    Costochondritis       -Discussed cold / heat therapy to affected area.   Relevant Medications   ibuprofen (ADVIL,MOTRIN) 600 MG tablet   cyclobenzaprine (FLEXERIL) 5 MG tablet      Meds ordered this encounter  Medications  . ARIPiprazole (ABILIFY) 5 MG tablet    Sig: Take 1 tablet (5 mg total) by mouth daily. After 1 week may increase to 2 tablets (10 mg total) by mouth daily.    Dispense:  60 tablet    Refill:  1  . ibuprofen (ADVIL,MOTRIN) 600 MG tablet    Sig: Take 1 tablet (600 mg total) by mouth every 8 (eight) hours as needed for moderate pain or cramping.    Dispense:  40  tablet    Refill:  0  . cyclobenzaprine (FLEXERIL) 5 MG tablet    Sig: Take 1 tablet (5 mg total) by mouth 2 (two) times daily as needed for muscle spasms.    Dispense:  14 tablet    Refill:  0    Follow-up: Return in about 6 weeks (around 03/04/2017), or if symptoms worsen or fail to improve, for Depression .   Lizbeth Bark FNP

## 2017-02-05 MED FILL — ARIPiprazole 5 MG TABS: 5 | 15 days supply | Qty: 30 | Fill #1

## 2017-02-07 ENCOUNTER — Other Ambulatory Visit: Payer: Self-pay | Admitting: Family Medicine

## 2017-02-07 DIAGNOSIS — G4700 Insomnia, unspecified: Secondary | ICD-10-CM

## 2017-02-14 ENCOUNTER — Other Ambulatory Visit: Payer: Self-pay | Admitting: Family Medicine

## 2017-02-14 DIAGNOSIS — G4700 Insomnia, unspecified: Secondary | ICD-10-CM

## 2017-03-04 ENCOUNTER — Ambulatory Visit: Payer: Self-pay | Admitting: Family Medicine

## 2017-03-04 NOTE — Progress Notes (Deleted)
   Subjective:  Patient ID: Stephen Garrison, male    DOB: 12/19/1985  Age: 31 y.o. MRN: [id number]  CC: No chief complaint on file.   HPI Stephen Garrison presents for    History of Depression: PHQ9 score elevated this visit. He denies any SI/HI. He was started on Abilify at last visit, per patient request.  Denies any adverse medication side effects???   Reports support system includes his mother. He is adherent with antidepressant medication. . Denies any worsening of depression symptoms. He reports receiving counseling services through his susbstance abuse program. He has also been given information for additional counseling resources in the past by LCSW in clinic. Reports minimal relief of symptoms.     Outpatient Medications Prior to Visit  Medication Sig Dispense Refill  . ARIPiprazole (ABILIFY) 5 MG tablet Take 1 tablet (5 mg total) by mouth daily. After 1 week may increase to 2 tablets (10 mg total) by mouth daily. 60 tablet 1  . cyclobenzaprine (FLEXERIL) 5 MG tablet Take 1 tablet (5 mg total) by mouth 2 (two) times daily as needed for muscle spasms. 14 tablet 0  . ibuprofen (ADVIL,MOTRIN) 600 MG tablet Take 1 tablet (600 mg total) by mouth every 8 (eight) hours as needed for moderate pain or cramping. 40 tablet 0  . methadone (DOLOPHINE) 10 MG/5ML solution Take 80 mg by mouth every morning.    . senna-docusate (SENNA S) 8.6-50 MG tablet Take 2 tablets by mouth at bedtime. 60 tablet 3   No facility-administered medications prior to visit.     ROS Review of Systems  Constitutional: Negative.   Respiratory: Negative.   Gastrointestinal: Negative.   Skin: Negative.   Psychiatric/Behavioral:       History of depression & polysubstance abuse.    Objective:  There were no vitals taken for this visit.  BP/Weight 01/21/2017 12/24/2016 12/10/2016  Systolic BP 134 131 129  Diastolic BP 83 87 88  Wt. (Lbs) 191.4 190 192.4  BMI 26.69 25.77 26.83    [facility]  01/21/2017 12/24/2016  Decreased Interest 3 3  Down, Depressed, Hopeless 3 3  PHQ - 2 Score 6 6  Altered sleeping 2 3  Tired, decreased energy 3 3  Change in appetite 2 3  Feeling bad or failure about yourself  3 3  Trouble concentrating 3 3  Moving slowly or fidgety/restless 0 0  Suicidal thoughts 1 2  PHQ-9 Score 20 23    Physical Exam  Cardiovascular: Normal rate, regular rhythm, normal heart sounds and intact distal pulses.   Pulmonary/Chest: Effort normal and breath sounds normal.  Abdominal: Soft. Bowel sounds are normal.  Skin: Skin is warm and dry.  Nursing note and vitals reviewed.   Assessment & Plan:   Problem List Items Addressed This Visit    None       No orders of the defined types were placed in this encounter.   Follow-up: No Follow-up on file.   Lizbeth BarkMandesia R Hairston FNP

## 2019-02-27 ENCOUNTER — Encounter (HOSPITAL_BASED_OUTPATIENT_CLINIC_OR_DEPARTMENT_OTHER): Payer: Self-pay | Admitting: Emergency Medicine

## 2019-02-27 ENCOUNTER — Emergency Department (HOSPITAL_BASED_OUTPATIENT_CLINIC_OR_DEPARTMENT_OTHER)
Admission: EM | Admit: 2019-02-27 | Discharge: 2019-02-27 | Disposition: A | Discharge status: Home / Self Care | Payer: BLUE CROSS/BLUE SHIELD | Attending: Emergency Medicine | Admitting: Emergency Medicine

## 2019-02-27 ENCOUNTER — Other Ambulatory Visit: Payer: Self-pay

## 2019-02-27 DIAGNOSIS — R1110 Vomiting, unspecified: Secondary | ICD-10-CM

## 2019-02-27 DIAGNOSIS — E876 Hypokalemia: Secondary | ICD-10-CM | POA: Diagnosis not present

## 2019-02-27 DIAGNOSIS — Z79899 Other long term (current) drug therapy: Secondary | ICD-10-CM | POA: Diagnosis not present

## 2019-02-27 DIAGNOSIS — F17210 Nicotine dependence, cigarettes, uncomplicated: Secondary | ICD-10-CM | POA: Diagnosis not present

## 2019-02-27 LAB — CBC WITH DIFFERENTIAL/PLATELET
Abs Immature Granulocytes: 0.03 10*3/uL (ref 0.00–0.07)
Basophils Absolute: 0.1 10*3/uL (ref 0.0–0.1)
Basophils Relative: 1 %
Eosinophils Absolute: 0.2 10*3/uL (ref 0.0–0.5)
Eosinophils Relative: 2 %
HCT: 44.3 % (ref 39.0–52.0)
Hemoglobin: 14.7 g/dL (ref 13.0–17.0)
Immature Granulocytes: 0 %
Lymphocytes Relative: 27 %
Lymphs Abs: 3 10*3/uL (ref 0.7–4.0)
MCH: 30.6 pg (ref 26.0–34.0)
MCHC: 33.2 g/dL (ref 30.0–36.0)
MCV: 92.3 fL (ref 80.0–100.0)
Monocytes Absolute: 1 10*3/uL (ref 0.1–1.0)
Monocytes Relative: 9 %
Neutro Abs: 6.8 10*3/uL (ref 1.7–7.7)
Neutrophils Relative %: 61 %
Platelets: 183 10*3/uL (ref 150–400)
RBC: 4.8 MIL/uL (ref 4.22–5.81)
RDW: 11.9 % (ref 11.5–15.5)
WBC: 11.1 10*3/uL — ABNORMAL HIGH (ref 4.0–10.5)
nRBC: 0 % (ref 0.0–0.2)

## 2019-02-27 LAB — BASIC METABOLIC PANEL
Anion gap: 8 (ref 5–15)
BUN: 17 mg/dL (ref 6–20)
CO2: 31 mmol/L (ref 22–32)
Calcium: 9.4 mg/dL (ref 8.9–10.3)
Chloride: 95 mmol/L — ABNORMAL LOW (ref 98–111)
Creatinine, Ser: 0.72 mg/dL (ref 0.61–1.24)
GFR calc Af Amer: >60 mL/min (ref >60)
GFR calc non Af Amer: >60 mL/min (ref >60)
Glucose, Bld: 117 mg/dL — ABNORMAL HIGH (ref 70–99)
Potassium: 3.4 mmol/L — ABNORMAL LOW (ref 3.5–5.1)
Sodium: 134 mmol/L — ABNORMAL LOW (ref 135–145)

## 2019-02-27 MED ORDER — LACTATED RINGERS IV BOLUS
1000.0000 mL | Freq: Once | INTRAVENOUS | Status: AC
  Administered 2019-02-27: 1000 mL via INTRAVENOUS

## 2019-02-27 MED ORDER — POTASSIUM CHLORIDE CRYS ER 20 MEQ PO TBCR
40.0000 meq | EXTENDED_RELEASE_TABLET | Freq: Once | ORAL | Status: AC
  Administered 2019-02-27: 17:00:00 40 meq via ORAL
  Filled 2019-02-27: qty 2

## 2019-02-27 NOTE — ED Notes (Signed)
ED Provider at bedside. 

## 2019-02-27 NOTE — ED Triage Notes (Signed)
Pt reports vomiting since last night. Denies pain.

## 2019-02-27 NOTE — ED Notes (Signed)
Pt reports he feels fatigued- voices concern for coronavirus. Dr. Criss Alvine at bedside

## 2019-02-27 NOTE — Discharge Instructions (Signed)
If you develop abdominal pain, uncontrolled vomiting, fever, chest or back pain, or any other new/concerning symptoms then return to the ER for evaluation.  

## 2019-02-27 NOTE — ED Provider Notes (Signed)
[facility] EMERGENCY DEPARTMENT Provider Note   CSN: [id number] Arrival date & time: 02/27/19  1456    History   Chief Complaint Chief Complaint  Patient presents with  . Emesis    HPI Stephen Garrison is a 33 y.o. male.     HPI  33 year old male presents with vomiting.  He states he started vomiting yesterday morning when he came back from [facility] for work.  He vomited numerous times yesterday.  He had a little bit of red in his emesis but also was drinking fruit punch in between vomiting.  No diarrhea.  No abdominal pain.  He is been having chills and hot spells though he has not taken his temperature.  No cough, shortness of breath or known contact with COVID-19 patient.  No chest pain.  The vomiting has stopped since 3 AM but he feels very fatigued and feels like his tongue is numb.  He is concerned he might be having symptoms of the coronavirus.  History reviewed. No pertinent past medical history.  Patient Active Problem List   Diagnosis Date Noted  . Depression 12/24/2016  . History of substance abuse (HCC) 12/24/2016    History reviewed. No pertinent surgical history.      Home Medications    Prior to Admission medications   Medication Sig Start Date End Date Taking? Authorizing Provider  ARIPiprazole (ABILIFY) 5 MG tablet Take 1 tablet (5 mg total) by mouth daily. After 1 week may increase to 2 tablets (10 mg total) by mouth daily. 01/21/17   Lizbeth Bark, FNP  cyclobenzaprine (FLEXERIL) 5 MG tablet Take 1 tablet (5 mg total) by mouth 2 (two) times daily as needed for muscle spasms. 01/21/17   Lizbeth Bark, FNP  ibuprofen (ADVIL,MOTRIN) 600 MG tablet Take 1 tablet (600 mg total) by mouth every 8 (eight) hours as needed for moderate pain or cramping. 01/21/17   Lizbeth Bark, FNP  methadone (DOLOPHINE) 10 MG/5ML solution Take 80 mg by mouth every morning.    [provider]  senna-docusate (SENNA S) 8.6-50 MG tablet  Take 2 tablets by mouth at bedtime. 12/10/16   Lizbeth Bark, FNP    Family History Family History  Problem Relation Age of Onset  . Hypertension Mother   . Panic disorder Mother   . Anxiety disorder Mother   . Diabetes Maternal Grandmother     Social History Social History   Tobacco Use  . Smoking status: Current Every Day Smoker    Packs/day: 0.50    Types: Cigarettes  . Smokeless tobacco: Current User  Substance Use Topics  . Alcohol use: No  . Drug use: No     Allergies   Patient has no known allergies.   Review of Systems Review of Systems  Constitutional: Positive for chills. Negative for fever.  Respiratory: Negative for cough and shortness of breath.   Cardiovascular: Negative for chest pain.  Gastrointestinal: Positive for vomiting. Negative for abdominal pain and diarrhea.  All other systems reviewed and are negative.    Physical Exam Updated Vital Signs BP (!) 143/78 (BP Location: Left Arm)   Pulse 83   Temp 98.6 F (37 C) (Oral)   Resp 18   Ht 6' (1.829 m)   Wt 77.1 kg   SpO2 100%   BMI 23.06 kg/m   Physical Exam Vitals signs and nursing note reviewed.  Constitutional:      General: He is not in acute distress.  Appearance: He is well-developed. He is not ill-appearing or diaphoretic.  HENT:     Head: Normocephalic and atraumatic.     Right Ear: External ear normal.     Left Ear: External ear normal.     Nose: Nose normal.     Mouth/Throat:     Comments: Somewhat dry mucous membranes Eyes:     General:        Right eye: No discharge.        Left eye: No discharge.  Neck:     Musculoskeletal: Neck supple.  Cardiovascular:     Rate and Rhythm: Normal rate and regular rhythm.     Heart sounds: Normal heart sounds.  Pulmonary:     Effort: Pulmonary effort is normal.     Breath sounds: Normal breath sounds.  Abdominal:     General: There is no distension.     Palpations: Abdomen is soft.     Tenderness: There is no  abdominal tenderness.  Skin:    General: Skin is warm and dry.  Neurological:     Mental Status: He is alert.  Psychiatric:        Mood and Affect: Mood is not anxious.      ED Treatments / Results  Labs (all labs ordered are listed, but only abnormal results are displayed) Labs Reviewed  BASIC METABOLIC PANEL - Abnormal; Notable for the following components:      Result Value   Sodium 134 (*)    Potassium 3.4 (*)    Chloride 95 (*)    Glucose, Bld 117 (*)    All other components within normal limits  CBC WITH DIFFERENTIAL/PLATELET - Abnormal; Notable for the following components:   WBC 11.1 (*)    All other components within normal limits    EKG None  Radiology No results found.  Procedures Procedures (including critical care time)  Medications Ordered in ED Medications  potassium chloride SA (K-DUR) CR tablet 40 mEq (has no administration in time range)  lactated ringers bolus 1,000 mL (1,000 mLs Intravenous New Bag/Given 02/27/19 1547)     Initial Impression / Assessment and Plan / ED Course  I have reviewed the triage vital signs and the nursing notes.  Pertinent labs & imaging results that were available during my care of the patient were reviewed by me and considered in my medical decision making (see chart for details).        Patient feels better after IV fluids here.  Mild WBC elevation likely from the numerous vomiting episodes.  He has a little bit of hypokalemia but not bad.  Renal function okay.  No abdominal tenderness or pain now.  While GI illness can be a presenting sign of COVID-19, he has no other signs or symptoms and I think it is less likely and probably more likely a GI illness.  He appears stable for discharge and we discussed return precautions.  Lessie Manigo was evaluated in Emergency Department on 02/27/2019 for the symptoms described in the history of present illness. He was evaluated in the context of the global COVID-19 pandemic, which  necessitated consideration that the patient might be at risk for infection with the SARS-CoV-2 virus that causes COVID-19. Institutional protocols and algorithms that pertain to the evaluation of patients at risk for COVID-19 are in a state of rapid change based on information released by regulatory bodies including the CDC and federal and state organizations. These policies and algorithms were followed during the patient's care  in the ED.   Final Clinical Impressions(s) / ED Diagnoses   Final diagnoses:  Vomiting in adult  Hypokalemia    ED Discharge Orders    None       Pricilla LovelessGoldston, Hadrian Yarbrough, MD 02/27/19 1651

## 2019-12-03 ENCOUNTER — Emergency Department (HOSPITAL_COMMUNITY): Payer: Self-pay

## 2019-12-03 ENCOUNTER — Other Ambulatory Visit: Payer: Self-pay

## 2019-12-03 ENCOUNTER — Encounter (HOSPITAL_BASED_OUTPATIENT_CLINIC_OR_DEPARTMENT_OTHER): Payer: Self-pay | Admitting: Emergency Medicine

## 2019-12-03 ENCOUNTER — Encounter (HOSPITAL_COMMUNITY): Payer: Self-pay | Admitting: Emergency Medicine

## 2019-12-03 ENCOUNTER — Emergency Department (HOSPITAL_COMMUNITY)
Admission: EM | Admit: 2019-12-03 | Discharge: 2019-12-03 | Disposition: A | Discharge status: Home / Self Care | Payer: Self-pay | Attending: Emergency Medicine | Admitting: Emergency Medicine

## 2019-12-03 DIAGNOSIS — S80812A Abrasion, left lower leg, initial encounter: Secondary | ICD-10-CM | POA: Insufficient documentation

## 2019-12-03 DIAGNOSIS — S0001XA Abrasion of scalp, initial encounter: Secondary | ICD-10-CM | POA: Insufficient documentation

## 2019-12-03 DIAGNOSIS — F17210 Nicotine dependence, cigarettes, uncomplicated: Secondary | ICD-10-CM | POA: Insufficient documentation

## 2019-12-03 DIAGNOSIS — Y999 Unspecified external cause status: Secondary | ICD-10-CM | POA: Insufficient documentation

## 2019-12-03 DIAGNOSIS — N200 Calculus of kidney: Secondary | ICD-10-CM | POA: Insufficient documentation

## 2019-12-03 DIAGNOSIS — Y929 Unspecified place or not applicable: Secondary | ICD-10-CM | POA: Insufficient documentation

## 2019-12-03 DIAGNOSIS — F1910 Other psychoactive substance abuse, uncomplicated: Secondary | ICD-10-CM | POA: Insufficient documentation

## 2019-12-03 DIAGNOSIS — R4182 Altered mental status, unspecified: Secondary | ICD-10-CM | POA: Insufficient documentation

## 2019-12-03 DIAGNOSIS — Y939 Activity, unspecified: Secondary | ICD-10-CM | POA: Insufficient documentation

## 2019-12-03 DIAGNOSIS — T148XXA Other injury of unspecified body region, initial encounter: Secondary | ICD-10-CM

## 2019-12-03 LAB — I-STAT CHEM 8, ED
BUN: 7 mg/dL (ref 6–20)
Calcium, Ion: 1.1 mmol/L — ABNORMAL LOW (ref 1.15–1.40)
Chloride: 98 mmol/L (ref 98–111)
Creatinine, Ser: 0.7 mg/dL (ref 0.61–1.24)
Glucose, Bld: 89 mg/dL (ref 70–99)
HCT: 36 % — ABNORMAL LOW (ref 39.0–52.0)
Hemoglobin: 12.2 g/dL — ABNORMAL LOW (ref 13.0–17.0)
Potassium: 3.9 mmol/L (ref 3.5–5.1)
Sodium: 136 mmol/L (ref 135–145)
TCO2: 31 mmol/L (ref 22–32)

## 2019-12-03 LAB — CBC
HCT: 37.6 % — ABNORMAL LOW (ref 39.0–52.0)
Hemoglobin: 12.4 g/dL — ABNORMAL LOW (ref 13.0–17.0)
MCH: 29.9 pg (ref 26.0–34.0)
MCHC: 33 g/dL (ref 30.0–36.0)
MCV: 90.6 fL (ref 80.0–100.0)
Platelets: 149 10*3/uL — ABNORMAL LOW (ref 150–400)
RBC: 4.15 MIL/uL — ABNORMAL LOW (ref 4.22–5.81)
RDW: 12.4 % (ref 11.5–15.5)
WBC: 5.8 10*3/uL (ref 4.0–10.5)
nRBC: 0 % (ref 0.0–0.2)

## 2019-12-03 LAB — COMPREHENSIVE METABOLIC PANEL
ALT: 112 U/L — ABNORMAL HIGH (ref 0–44)
AST: 104 U/L — ABNORMAL HIGH (ref 15–41)
Albumin: 3.5 g/dL (ref 3.5–5.0)
Alkaline Phosphatase: 49 U/L (ref 38–126)
Anion gap: 8 (ref 5–15)
BUN: 6 mg/dL (ref 6–20)
CO2: 27 mmol/L (ref 22–32)
Calcium: 9.1 mg/dL (ref 8.9–10.3)
Chloride: 101 mmol/L (ref 98–111)
Creatinine, Ser: 0.74 mg/dL (ref 0.61–1.24)
GFR calc Af Amer: >60 mL/min (ref >60)
GFR calc non Af Amer: >60 mL/min (ref >60)
Glucose, Bld: 91 mg/dL (ref 70–99)
Potassium: 4 mmol/L (ref 3.5–5.1)
Sodium: 136 mmol/L (ref 135–145)
Total Bilirubin: 0.6 mg/dL (ref 0.3–1.2)
Total Protein: 6 g/dL — ABNORMAL LOW (ref 6.5–8.1)

## 2019-12-03 LAB — URINALYSIS, ROUTINE W REFLEX MICROSCOPIC
Bilirubin Urine: NEGATIVE
Glucose, UA: NEGATIVE mg/dL
Ketones, ur: NEGATIVE mg/dL
Leukocytes,Ua: NEGATIVE
Nitrite: NEGATIVE
Protein, ur: NEGATIVE mg/dL
Specific Gravity, Urine: 1.024 (ref 1.005–1.030)
pH: 7 (ref 5.0–8.0)

## 2019-12-03 LAB — RAPID URINE DRUG SCREEN, HOSP PERFORMED
Amphetamines: POSITIVE — AB
Barbiturates: NOT DETECTED
Benzodiazepines: POSITIVE — AB
Cocaine: NOT DETECTED
Opiates: NOT DETECTED
Tetrahydrocannabinol: NOT DETECTED

## 2019-12-03 LAB — LACTIC ACID, PLASMA: Lactic Acid, Venous: 1 mmol/L (ref 0.5–1.9)

## 2019-12-03 LAB — PROTIME-INR
INR: 1 (ref 0.8–1.2)
Prothrombin Time: 13.1 seconds (ref 11.4–15.2)

## 2019-12-03 LAB — ETHANOL: Alcohol, Ethyl (B): <10 mg/dL (ref <10)

## 2019-12-03 LAB — SAMPLE TO BLOOD BANK

## 2019-12-03 LAB — CDS SEROLOGY

## 2019-12-03 MED ORDER — SODIUM CHLORIDE 0.9 % IV SOLN
INTRAVENOUS | Status: DC
  Administered 2019-12-03: 06:00:00 125 mL/h via INTRAVENOUS

## 2019-12-03 MED ORDER — SODIUM CHLORIDE 0.9 % IV BOLUS
1000.0000 mL | Freq: Once | INTRAVENOUS | Status: AC
  Administered 2019-12-03: 1000 mL via INTRAVENOUS

## 2019-12-03 MED ORDER — IOHEXOL 300 MG/ML  SOLN
100.0000 mL | Freq: Once | INTRAMUSCULAR | Status: AC | PRN
  Administered 2019-12-03: 04:00:00 100 mL via INTRAVENOUS

## 2019-12-03 MED ORDER — SODIUM CHLORIDE 0.9 % IV SOLN
INTRAVENOUS | Status: AC | PRN
  Administered 2019-12-03: 2000 mL via INTRAVENOUS

## 2019-12-03 MED ORDER — SODIUM CHLORIDE 0.9 % IV BOLUS
1000.0000 mL | Freq: Once | INTRAVENOUS | Status: AC
  Administered 2019-12-03: 04:00:00 1000 mL via INTRAVENOUS

## 2019-12-03 NOTE — ED Notes (Signed)
Spoke with Georgian Co who states she is his "baby momma" she would like to speak with patient but RN says he is not able to take calls at this time. Gave her contact information as [phone] and [phone]

## 2019-12-03 NOTE — ED Notes (Signed)
Pt dc'd home w/all belongings, a/o x4, ambulatory from dc, called [employer] for transport

## 2019-12-03 NOTE — Progress Notes (Signed)
CSW received call from ED case manager this morning regarding concerns as patient's daughter was discharged earlier from ED and questions regarding custody as well as concerns due to patient's positive urine toxicology screen. Peds Nursing AD, Melford Aase, called to number of person identified as aunt in chart, Georgian Co [phone]). Per Ms. Mayford Knife, patient's daughter was discharged to and is currently with mother, Arlee Muslim [phone]).  CSW called to [facility] CPS, Burnis Kingfisher [phone]) to make report. Message left, will follow up.    Gerrie Nordmann, LCSW [phone]

## 2019-12-03 NOTE — ED Notes (Signed)
Pt is textinghis mother
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
## 2019-12-03 NOTE — ED Notes (Signed)
Pt ambulated self efficiently to restroom with no difficulty. Pt returned safely to bedside. Pt given water. Pt appears drowsy.
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
## 2019-12-03 NOTE — Progress Notes (Signed)
CSW completed referral to [facility] CPS with intake, Burnis Kingfisher [phone]).   Gerrie Nordmann, LCSW [phone]
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
## 2019-12-03 NOTE — ED Notes (Signed)
Mother updated about POC by this RN, provider and by the pt.

## 2019-12-03 NOTE — Discharge Instructions (Addendum)
Do not take any drugs or prescription medication not prescribed to you.  Do not drive or operate heavy machinery for at least 24 hours Use tylenol and ibuprofen as well as hot and cold therapy for pain
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
## 2019-12-03 NOTE — ED Notes (Signed)
In and out catheter attempted, pt got aggressive during the procedure and refuses for [place] to get the urine. Pt is trying to urinate for the urine sample, provider notified.
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
## 2021-02-17 ENCOUNTER — Emergency Department (HOSPITAL_COMMUNITY): Payer: Self-pay

## 2021-02-17 ENCOUNTER — Emergency Department (HOSPITAL_COMMUNITY)
Admission: EM | Admit: 2021-02-17 | Discharge: 2021-02-17 | Disposition: A | Discharge status: Home / Self Care | Payer: Self-pay | Attending: Emergency Medicine | Admitting: Emergency Medicine

## 2021-02-17 DIAGNOSIS — F17210 Nicotine dependence, cigarettes, uncomplicated: Secondary | ICD-10-CM | POA: Insufficient documentation

## 2021-02-17 DIAGNOSIS — R4182 Altered mental status, unspecified: Secondary | ICD-10-CM | POA: Insufficient documentation

## 2021-02-17 DIAGNOSIS — F332 Major depressive disorder, recurrent severe without psychotic features: Secondary | ICD-10-CM | POA: Insufficient documentation

## 2021-02-17 DIAGNOSIS — F1120 Opioid dependence, uncomplicated: Secondary | ICD-10-CM | POA: Insufficient documentation

## 2021-02-17 DIAGNOSIS — T50901A Poisoning by unspecified drugs, medicaments and biological substances, accidental (unintentional), initial encounter: Secondary | ICD-10-CM

## 2021-02-17 LAB — COMPREHENSIVE METABOLIC PANEL
ALT: 65 U/L — ABNORMAL HIGH (ref 0–44)
AST: 42 U/L — ABNORMAL HIGH (ref 15–41)
Albumin: 4 g/dL (ref 3.5–5.0)
Alkaline Phosphatase: 54 U/L (ref 38–126)
Anion gap: 8 (ref 5–15)
BUN: 16 mg/dL (ref 6–20)
CO2: 31 mmol/L (ref 22–32)
Calcium: 9.5 mg/dL (ref 8.9–10.3)
Chloride: 100 mmol/L (ref 98–111)
Creatinine, Ser: 1.04 mg/dL (ref 0.61–1.24)
GFR, Estimated: >60 mL/min (ref >60)
Glucose, Bld: 76 mg/dL (ref 70–99)
Potassium: 3.5 mmol/L (ref 3.5–5.1)
Sodium: 139 mmol/L (ref 135–145)
Total Bilirubin: 0.5 mg/dL (ref 0.3–1.2)
Total Protein: 6.7 g/dL (ref 6.5–8.1)

## 2021-02-17 LAB — I-STAT ARTERIAL BLOOD GAS, ED
Acid-Base Excess: 4 mmol/L — ABNORMAL HIGH (ref 0.0–2.0)
Bicarbonate: 30.4 mmol/L — ABNORMAL HIGH (ref 20.0–28.0)
Calcium, Ion: 1.24 mmol/L (ref 1.15–1.40)
HCT: 36 % — ABNORMAL LOW (ref 39.0–52.0)
Hemoglobin: 12.2 g/dL — ABNORMAL LOW (ref 13.0–17.0)
O2 Saturation: 99 %
Patient temperature: 98.6
Potassium: 3.4 mmol/L — ABNORMAL LOW (ref 3.5–5.1)
Sodium: 139 mmol/L (ref 135–145)
TCO2: 32 mmol/L (ref 22–32)
pCO2 arterial: 51.9 mmHg — ABNORMAL HIGH (ref 32.0–48.0)
pH, Arterial: 7.375 (ref 7.350–7.450)
pO2, Arterial: 140 mmHg — ABNORMAL HIGH (ref 83.0–108.0)

## 2021-02-17 LAB — CBC WITH DIFFERENTIAL/PLATELET
Abs Immature Granulocytes: 0.03 10*3/uL (ref 0.00–0.07)
Basophils Absolute: 0.1 10*3/uL (ref 0.0–0.1)
Basophils Relative: 1 %
Eosinophils Absolute: 0.6 10*3/uL — ABNORMAL HIGH (ref 0.0–0.5)
Eosinophils Relative: 6 %
HCT: 44.2 % (ref 39.0–52.0)
Hemoglobin: 14.7 g/dL (ref 13.0–17.0)
Immature Granulocytes: 0 %
Lymphocytes Relative: 44 %
Lymphs Abs: 3.9 10*3/uL (ref 0.7–4.0)
MCH: 30.6 pg (ref 26.0–34.0)
MCHC: 33.3 g/dL (ref 30.0–36.0)
MCV: 92.1 fL (ref 80.0–100.0)
Monocytes Absolute: 1 10*3/uL (ref 0.1–1.0)
Monocytes Relative: 11 %
Neutro Abs: 3.4 10*3/uL (ref 1.7–7.7)
Neutrophils Relative %: 38 %
Platelets: 182 10*3/uL (ref 150–400)
RBC: 4.8 MIL/uL (ref 4.22–5.81)
RDW: 12.1 % (ref 11.5–15.5)
WBC: 9 10*3/uL (ref 4.0–10.5)
nRBC: 0 % (ref 0.0–0.2)

## 2021-02-17 LAB — RAPID URINE DRUG SCREEN, HOSP PERFORMED
Amphetamines: POSITIVE — AB
Barbiturates: NOT DETECTED
Benzodiazepines: POSITIVE — AB
Cocaine: NOT DETECTED
Opiates: NOT DETECTED
Tetrahydrocannabinol: NOT DETECTED

## 2021-02-17 LAB — ACETAMINOPHEN LEVEL: Acetaminophen (Tylenol), Serum: <10 ug/mL — ABNORMAL LOW (ref 10–30)

## 2021-02-17 LAB — ETHANOL: Alcohol, Ethyl (B): <10 mg/dL (ref <10)

## 2021-02-17 LAB — SALICYLATE LEVEL: Salicylate Lvl: <7 mg/dL — ABNORMAL LOW (ref 7.0–30.0)

## 2021-02-17 LAB — CBG MONITORING, ED: Glucose-Capillary: 74 mg/dL (ref 70–99)

## 2021-02-17 MED ORDER — NALOXONE HCL 0.4 MG/ML IJ SOLN
INTRAMUSCULAR | Status: AC
  Administered 2021-02-17: 0.4 mg/mL
  Filled 2021-02-17: qty 1

## 2021-02-17 MED ORDER — SODIUM CHLORIDE 0.9 % IV BOLUS
1000.0000 mL | Freq: Once | INTRAVENOUS | Status: AC
Start: 2021-02-17 — End: 2021-02-17
  Administered 2021-02-17: 1000 mL via INTRAVENOUS

## 2021-02-17 MED ORDER — NALOXONE HCL 2 MG/2ML IJ SOSY
PREFILLED_SYRINGE | INTRAMUSCULAR | Status: AC
  Administered 2021-02-17: 2 mg
  Filled 2021-02-17: qty 2

## 2021-02-17 NOTE — ED Notes (Signed)
Patient now awake, AOx4. Margaux,PA notified.

## 2021-02-17 NOTE — ED Provider Notes (Addendum)
3:25 PM Possible drug overdose. Admits to oral Xanax. Lethargic for several hours, now awake, awaiting TTS. Work-up is unremarkable. He is voluntary.   3:42 PM Per BH, patient is psych cleared.   BP 115/75   Pulse (!) 48   Temp 97.9 F (36.6 C) (Oral)   Resp 16   Ht 6' (1.829 m)   Wt 68 kg   SpO2 100%   BMI 20.33 kg/m   3:54 PM Patient is up walking in hallway.  UDS returned positive for benzodiazepines and amphetamines.      Renne Crigler, PA-C 02/17/21 1558    Cheryll Cockayne, MD 02/18/21 [phone]

## 2021-02-17 NOTE — ED Notes (Signed)
Pt now starting to display some snoring respirations but still maintaining normal respiratory rate. Verbal order to give .4 mg of narcan received from [place], [place].

## 2021-02-17 NOTE — ED Notes (Signed)
Interpreter used for pt's mother (ASL) to communicate with ED staff.

## 2021-02-17 NOTE — ED Provider Notes (Signed)
[facility] EMERGENCY DEPARTMENT Provider Note   CSN: [id number] Arrival date & time: 02/17/21  0849     History Chief Complaint  Patient presents with  . Drug Overdose   LEVEL 5 CAVEAT - ALTERED MENTAL STATUS S/2 DRUG OVERDOSE  Niam Nepomuceno is a 35 y.o. male with PMHx depression and substance abuse who presents to the ED today via personal vehicle with complaint of drug overdose. Pt initially reported taking "one xanax by mouth" earlier today. Mom is at bedside - via interpretor services for ASL just reports that pt has a hx of substance abuse and drug overdoses in the past. He was doing well for the past week however has been talking to his ex girlfriend which mom believes upset him. He came home around 3 AM last night and seemed more tired than normal but was not able to sleep per mom. She went and checked on him again this morning around 7 AM and he was very drowsy/unresponsive. Her and her husband attempted to poor cold water on pt without much response and pt's father had to pick him up and put him in the car. Mom attempted to drive pt to his suboxone clinic (has been on suboxone for the last month or so after detox) however they advised she come to the ED given pt's overall appearance. Mom is unsure what drugs he has done in the past  - she reports sometimes he will take xanax and abilify to "help him calm down."   The history is provided by the patient, a parent and medical records. The history is limited by a language barrier. A language interpreter was used (ASL for mom).       No past medical history on file.  Patient Active Problem List   Diagnosis Date Noted  . Depression 12/24/2016  . History of substance abuse (HCC) 12/24/2016    No past surgical history on file.     Family History  Problem Relation Age of Onset  . Hypertension Mother   . Panic disorder Mother   . Anxiety disorder Mother   . Diabetes Maternal Grandmother     Social History    Tobacco Use  . Smoking status: Current Every Day Smoker    Packs/day: 1.00    Types: Cigarettes  . Smokeless tobacco: Never Used  Substance Use Topics  . Alcohol use: Not Currently  . Drug use: Never    Home Medications Prior to Admission medications   Medication Sig Start Date End Date Taking? Authorizing Provider  ARIPiprazole (ABILIFY) 5 MG tablet Take 1 tablet (5 mg total) by mouth daily. After 1 week may increase to 2 tablets (10 mg total) by mouth daily. 01/21/17   Lizbeth Bark, FNP  cyclobenzaprine (FLEXERIL) 5 MG tablet Take 1 tablet (5 mg total) by mouth 2 (two) times daily as needed for muscle spasms. 01/21/17   Lizbeth Bark, FNP  ibuprofen (ADVIL,MOTRIN) 600 MG tablet Take 1 tablet (600 mg total) by mouth every 8 (eight) hours as needed for moderate pain or cramping. 01/21/17   Lizbeth Bark, FNP  methadone (DOLOPHINE) 10 MG/5ML solution Take 80 mg by mouth every morning.    [provider]  senna-docusate (SENNA S) 8.6-50 MG tablet Take 2 tablets by mouth at bedtime. 12/10/16   Lizbeth Bark, FNP    Allergies    Patient has no known allergies.  Review of Systems   Review of Systems  Unable to perform ROS: Mental  status change    Physical Exam Updated Vital Signs BP 101/68   Pulse 61   Temp 97.9 F (36.6 C) (Oral)   Resp 13   Ht 6' (1.829 m)   Wt 68 kg   SpO2 100%   BMI 20.33 kg/m   Physical Exam Vitals and nursing note reviewed.  Constitutional:      Appearance: He is not ill-appearing.     Comments: Drowsy, arousable to painful stimuli  HENT:     Head: Normocephalic and atraumatic.     Mouth/Throat:     Mouth: Mucous membranes are dry.  Eyes:     Pupils: Pupils are equal, round, and reactive to light.     Comments: Pinpoint pupils  Cardiovascular:     Rate and Rhythm: Normal rate and regular rhythm.     Pulses: Normal pulses.  Pulmonary:     Effort: Pulmonary effort is normal.     Breath sounds: Normal  breath sounds. No wheezing, rhonchi or rales.  Abdominal:     Palpations: Abdomen is soft.     Tenderness: There is no abdominal tenderness.  Musculoskeletal:     Cervical back: Neck supple.  Skin:    General: Skin is warm and dry.  Neurological:     Mental Status: He is alert.     Comments: Alert and oriented x 4. Able to follow my commands initially.      ED Results / Procedures / Treatments   Labs (all labs ordered are listed, but only abnormal results are displayed) Labs Reviewed  COMPREHENSIVE METABOLIC PANEL  ETHANOL  RAPID URINE DRUG SCREEN, HOSP PERFORMED  CBC WITH DIFFERENTIAL/PLATELET  SALICYLATE LEVEL  ACETAMINOPHEN LEVEL  CBG MONITORING, ED  I-STAT ARTERIAL BLOOD GAS, ED    EKG EKG Interpretation  Date/Time:  Saturday February 17 2021 09:01:17 EDT Ventricular Rate:  60 PR Interval:  142 QRS Duration: 88 QT Interval:  439 QTC Calculation: 439 R Axis:   79 Text Interpretation: Sinus arrhythmia no acute ST/T changes No old tracing to compare Confirmed by Pricilla Loveless [phone]) on 02/17/2021 9:04:28 AM   Radiology No results found.  Procedures Procedures   Medications Ordered in ED Medications  naloxone (NARCAN) 0.4 MG/ML injection (0.4 mg/mL  Given 02/17/21 0906)  naloxone [facility]) 2 MG/2ML injection (2 mg  Given 02/17/21 2992)    ED Course  I have reviewed the triage vital signs and the nursing notes.  Pertinent labs & imaging results that were available during my care of the patient were reviewed by me and considered in my medical decision making (see chart for details).    MDM Rules/Calculators/A&P                          35 year old male who presents to the ED today via personal vehicle for possible drug overdose.  Brought into the ED by mom who speaks American sign language, interpretive services used.  Patient has history of overdose in the past. Patient was able to tell myself initially that he took "1 oral Xanax" however given overall  physical appearance I suspect that it is more.  He was initially alert and oriented x4 and able to follow all my commands.  He was able to transfer from the wheelchair into the bed with minor assistance.  Shortly afterwards he laid down he began snoring.  He was initially given 0.4 mg of Narcan without much response.  At that time attending physician Dr. Criss Alvine  evaluated patient as well and additional 2 mg without much response.  He is protecting his airway at this time, satting appropriately at 100% on room air.  Heart rate stable in the low 60s and high 50s.  Will work-up for overdose at this time however given altered mental status and mother not personally visualizing patient taking anything we will plan for CT head and ABG as well. If patient declines he may require intubation.   CBG on arrival 74  CT head without any acute findings at this time. CBC without leukocytosis and hemoglobin stable at 14.7. BMP with slight elevation in AST and ALT at 42/65.  No other electrolyte derangements. ABG unremarkable EtOH, Salicylate, and Tylenol levels negative  Pt monitored in the ED for over 4 hours. He continued to be snoring loudly, protecting his airway, however only responsive to painful stimuli. Given his continued symptoms it was decided that he would require admission for observation. After about 30 more minutes pt suddenly became more responsive and awake. I evaluated patient at that time; he is fully awake and able to have a conversation with me. He denies any attempt to self harm. He does admit that he has not been sleeping well for the past few days due to "getting into it with my old lady" and took 1-2 Xanax sometime "yesterday" by mouth. Pt believes they were 2 mg Xanax. He denies taking anything else. Given pt is now more responsive I do not think he requires admission; discussed with attending physician Dr. Criss Alvine who agrees however will have TTS see patient.   TTS currently evaluating  patient; will await their recommendations.   At shift change case signed out to oncoming provider Rhea Bleacher, PA-C, who will dispo patient accordingly. UDS pending at this time.   This note was prepared using Dragon voice recognition software and may include unintentional dictation errors due to the inherent limitations of voice recognition software.   Final Clinical Impression(s) / ED Diagnoses Final diagnoses:  None    Rx / DC Orders ED Discharge Orders    None       Tanda Rockers, PA-C 02/17/21 1501    Pricilla Loveless, MD 02/18/21 [phone]
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
## 2021-02-17 NOTE — ED Triage Notes (Addendum)
Pt arrives POV with mother after ingestion of benzodiazapines. Pt reports "taking one xanax by mouth". Pt lethargic upon initial assessment, but breathing normally. Pt unable to advise the last time he took the xanax. Pt does state this was not intentional.  Pt VSS at this time. Marguaux, PA at bedside at this time evaluating.

## 2021-02-17 NOTE — ED Notes (Signed)
Patient transported to CT with this RN present.

## 2021-02-17 NOTE — BHH Counselor (Signed)
Per [name], PMHNP patient does not meet in patient care criteria and is psych cleared for discharge. Patient to follow up with [facility] for outpatient care. [name], RN notified of disposition.

## 2021-02-17 NOTE — ED Notes (Signed)
Notified Marguax, PA of brief periods of bradycardia lowering to 42 BMPs briefly and then returning to baseline HR of around 56-60 BPM.

## 2021-02-17 NOTE — ED Notes (Signed)
Patient still sleeping, but more responsive when this RN tapped his shoulder to wake up stating for me to "stop". Pt very quickly went back to sleep after this.

## 2021-05-15 ENCOUNTER — Other Ambulatory Visit: Payer: Self-pay

## 2021-05-15 ENCOUNTER — Emergency Department (HOSPITAL_BASED_OUTPATIENT_CLINIC_OR_DEPARTMENT_OTHER): Payer: Self-pay

## 2021-05-15 ENCOUNTER — Emergency Department (HOSPITAL_BASED_OUTPATIENT_CLINIC_OR_DEPARTMENT_OTHER)
Admission: EM | Admit: 2021-05-15 | Discharge: 2021-05-15 | Disposition: A | Discharge status: Home / Self Care | Payer: Self-pay | Attending: Emergency Medicine | Admitting: Emergency Medicine

## 2021-05-15 ENCOUNTER — Encounter (HOSPITAL_BASED_OUTPATIENT_CLINIC_OR_DEPARTMENT_OTHER): Payer: Self-pay | Admitting: Obstetrics and Gynecology

## 2021-05-15 DIAGNOSIS — L0390 Cellulitis, unspecified: Secondary | ICD-10-CM

## 2021-05-15 DIAGNOSIS — F17210 Nicotine dependence, cigarettes, uncomplicated: Secondary | ICD-10-CM | POA: Insufficient documentation

## 2021-05-15 DIAGNOSIS — M79661 Pain in right lower leg: Secondary | ICD-10-CM | POA: Insufficient documentation

## 2021-05-15 DIAGNOSIS — M7989 Other specified soft tissue disorders: Secondary | ICD-10-CM | POA: Insufficient documentation

## 2021-05-15 LAB — COMPREHENSIVE METABOLIC PANEL
ALT: 483 U/L — ABNORMAL HIGH (ref 0–44)
AST: 194 U/L — ABNORMAL HIGH (ref 15–41)
Albumin: 3.6 g/dL (ref 3.5–5.0)
Alkaline Phosphatase: 103 U/L (ref 38–126)
Anion gap: 6 (ref 5–15)
BUN: 14 mg/dL (ref 6–20)
CO2: 30 mmol/L (ref 22–32)
Calcium: 8.9 mg/dL (ref 8.9–10.3)
Chloride: 99 mmol/L (ref 98–111)
Creatinine, Ser: 0.86 mg/dL (ref 0.61–1.24)
GFR, Estimated: >60 mL/min (ref >60)
Glucose, Bld: 94 mg/dL (ref 70–99)
Potassium: 3.7 mmol/L (ref 3.5–5.1)
Sodium: 135 mmol/L (ref 135–145)
Total Bilirubin: 0.5 mg/dL (ref 0.3–1.2)
Total Protein: 6.5 g/dL (ref 6.5–8.1)

## 2021-05-15 LAB — CBC WITH DIFFERENTIAL/PLATELET
Abs Immature Granulocytes: 0.07 10*3/uL (ref 0.00–0.07)
Basophils Absolute: 0.1 10*3/uL (ref 0.0–0.1)
Basophils Relative: 1 %
Eosinophils Absolute: 0.2 10*3/uL (ref 0.0–0.5)
Eosinophils Relative: 2 %
HCT: 41.9 % (ref 39.0–52.0)
Hemoglobin: 14 g/dL (ref 13.0–17.0)
Immature Granulocytes: 1 %
Lymphocytes Relative: 25 %
Lymphs Abs: 2.4 10*3/uL (ref 0.7–4.0)
MCH: 30.1 pg (ref 26.0–34.0)
MCHC: 33.4 g/dL (ref 30.0–36.0)
MCV: 90.1 fL (ref 80.0–100.0)
Monocytes Absolute: 1 10*3/uL (ref 0.1–1.0)
Monocytes Relative: 11 %
Neutro Abs: 5.7 10*3/uL (ref 1.7–7.7)
Neutrophils Relative %: 60 %
Platelets: 194 10*3/uL (ref 150–400)
RBC: 4.65 MIL/uL (ref 4.22–5.81)
RDW: 12.1 % (ref 11.5–15.5)
WBC: 9.4 10*3/uL (ref 4.0–10.5)
nRBC: 0 % (ref 0.0–0.2)

## 2021-05-15 LAB — LACTIC ACID, PLASMA: Lactic Acid, Venous: 0.8 mmol/L (ref 0.5–1.9)

## 2021-05-15 MED ORDER — CEFAZOLIN SODIUM-DEXTROSE 1-4 GM/50ML-% IV SOLN
1.0000 g | Freq: Once | INTRAVENOUS | Status: AC
  Administered 2021-05-15: 1 g via INTRAVENOUS
  Filled 2021-05-15: qty 50

## 2021-05-15 MED ORDER — ONDANSETRON HCL 4 MG/2ML IJ SOLN: 4.0000 mg | Freq: Once | INTRAMUSCULAR | Status: DC

## 2021-05-15 MED ORDER — DOXYCYCLINE HYCLATE 100 MG PO CAPS: 100.0000 mg | ORAL_CAPSULE | Freq: Two times a day (BID) | ORAL | 0 refills | Status: AC

## 2021-05-15 NOTE — ED Notes (Signed)
Pt is aware we need a urine specimen, urinal @ bedside.  

## 2021-05-15 NOTE — ED Triage Notes (Signed)
Patient has redness, swelling and heat to the right leg from the mid calf to the toes. Suspicious for cellulitis.

## 2021-05-15 NOTE — ED Provider Notes (Signed)
[facility] EMERGENCY DEPT Provider Note   CSN: [id number] Arrival date & time: 05/15/21  1354     History Chief Complaint  Patient presents with   Cellulitis    Stephen Garrison is a 35 y.o. male.  HPI  35 year old male with past medical history of polysubstance abuse, alcohol abuse presents the emergency department concern for right lower extremity swelling/redness and discomfort.  Patient states over the past 2 days the right calf extending out to the ankle and foot has slowly become more swollen, red and painful with ambulation.  He denies any fever or systemic symptoms.  No injury to the leg, no history of DVTs.  No history of gout.  Patient states he took a couple leftover antibiotics that he had without significant improvement in his symptoms.  Denies any other symptoms including chest pain, shortness of breath.  History reviewed. No pertinent past medical history.  Patient Active Problem List   Diagnosis Date Noted   Depression 12/24/2016   History of substance abuse (HCC) 12/24/2016    History reviewed. No pertinent surgical history.     Family History  Problem Relation Age of Onset   Hypertension Mother    Panic disorder Mother    Anxiety disorder Mother    Diabetes Maternal Grandmother     Social History   Tobacco Use   Smoking status: Every Day    Packs/day: 1.00    Years: 15.00    Pack years: 15.00    Types: Cigarettes   Smokeless tobacco: Never  Vaping Use   Vaping Use: Former  Substance Use Topics   Alcohol use: Not Currently   Drug use: Yes    Types: Methamphetamines    Comment: Adderall    Home Medications Prior to Admission medications   Medication Sig Start Date End Date Taking? Authorizing Provider  hydrocortisone cream 0.5 % Apply 1 application topically 2 (two) times daily.   Yes [provider]  neomycin-bacitracin-polymyxin (NEOSPORIN) ointment Apply 1 application topically every 12 (twelve) hours.   Yes  [provider]  ARIPiprazole (ABILIFY) 5 MG tablet Take 1 tablet (5 mg total) by mouth daily. After 1 week may increase to 2 tablets (10 mg total) by mouth daily. Patient not taking: No sig reported 01/21/17 02/17/21  Lizbeth Bark, FNP    Allergies    Patient has no known allergies.  Review of Systems   Review of Systems  Constitutional:  Negative for chills and fever.  HENT:  Negative for congestion.   Eyes:  Negative for visual disturbance.  Respiratory:  Negative for shortness of breath.   Cardiovascular:  Negative for chest pain.  Gastrointestinal:  Negative for abdominal pain, diarrhea and vomiting.  Genitourinary:  Negative for dysuria.  Musculoskeletal:  Negative for back pain and neck pain.       RLL swelling and redness  Skin:  Negative for rash.  Neurological:  Negative for headaches.   Physical Exam Updated Vital Signs BP 123/84 (BP Location: Right Arm)   Pulse 65   Temp 99.2 F (37.3 C)   Resp 18   Ht 5\' 10"  (1.778 m)   Wt 74.8 kg   SpO2 98%   BMI 23.68 kg/m   Physical Exam Vitals and nursing note reviewed.  Constitutional:      Appearance: Normal appearance.  HENT:     Head: Normocephalic.     Mouth/Throat:     Mouth: Mucous membranes are moist.  Cardiovascular:     Rate and  Rhythm: Normal rate.  Pulmonary:     Effort: Pulmonary effort is normal. No respiratory distress.  Abdominal:     Palpations: Abdomen is soft.     Tenderness: There is no abdominal tenderness.  Musculoskeletal:     Comments: Edema and erythema extending from the mid right calf through the ankle and foot, +DP pulse and cap refill, no pain with passive movement of ankle joint, knee is unremarkable, no wound of the RLE  Skin:    General: Skin is warm.  Neurological:     Mental Status: He is alert and oriented to person, place, and time. Mental status is at baseline.  Psychiatric:        Mood and Affect: Mood normal.    ED Results / Procedures / Treatments    Labs (all labs ordered are listed, but only abnormal results are displayed) Labs Reviewed  COMPREHENSIVE METABOLIC PANEL - Abnormal; Notable for the following components:      Result Value   AST 194 (*)    ALT 483 (*)    All other components within normal limits  LACTIC ACID, PLASMA  CBC WITH DIFFERENTIAL/PLATELET  LACTIC ACID, PLASMA  URINALYSIS, ROUTINE W REFLEX MICROSCOPIC    EKG None  Radiology No results found.  Procedures Procedures   Medications Ordered in ED Medications - No data to display  ED Course  I have reviewed the triage vital signs and the nursing notes.  Pertinent labs & imaging results that were available during my care of the patient were reviewed by me and considered in my medical decision making (see chart for details).    MDM Rules/Calculators/A&P                           35 year old male presents the emergency department with swelling and redness of the right lower extremity.  Patient has edema and erythema extending from the mid calf out to the foot.  No knee involvement.  Normal peripheral perfusion, neuro intact.  No pain with passive movement, low suspicious for septic ankle joint.  No history of gout.  DVT study was negative.  Patient will be treated for cellulitis, given dose of IV antibiotics here in the department will be discharged on oral.  Strict return to ED precautions discussed.  Patient at this time appears safe and stable for discharge and will be treated as an outpatient.  Discharge plan and strict return to ED precautions discussed, patient verbalizes understanding and agreement.  Final Clinical Impression(s) / ED Diagnoses Final diagnoses:  None    Rx / DC Orders ED Discharge Orders     None        Rozelle Logan, DO 05/15/21 1844

## 2021-05-15 NOTE — Discharge Instructions (Addendum)
You have been seen and discharged from the emergency department.  Take antibiotic as directed.  Stay well-hydrated.  Follow-up with your primary provider for reevaluation and further care. Take home medications as prescribed. If you have any worsening symptoms, worsening redness/swelling, high fevers or further concerns for your health please return to an emergency department for further evaluation.

## 2021-06-26 ENCOUNTER — Ambulatory Visit: Payer: Medicaid Other | Admitting: Nurse Practitioner
